# Patient Record
Sex: Male | Born: 1975 | Race: White | Hispanic: No | Marital: Married | Smoking: Former smoker
Health system: Southern US, Community
[De-identification: ages and names within clinical notes are randomized; demographics above are authoritative.]

## PROBLEM LIST (undated history)

## (undated) DIAGNOSIS — G4730 Sleep apnea, unspecified: Secondary | ICD-10-CM

## (undated) HISTORY — PX: SPINE SURGERY: SHX786

## (undated) HISTORY — DX: Sleep apnea, unspecified: G47.30

---

## 2004-09-27 ENCOUNTER — Encounter: Admission: RE | Admit: 2004-09-27 | Discharge: 2004-09-27 | Payer: Self-pay | Admitting: Family Medicine

## 2004-10-12 ENCOUNTER — Ambulatory Visit (HOSPITAL_COMMUNITY): Admission: RE | Admit: 2004-10-12 | Discharge: 2004-10-12 | Payer: Self-pay | Admitting: Anesthesiology

## 2005-06-03 ENCOUNTER — Emergency Department (HOSPITAL_COMMUNITY): Admission: EM | Admit: 2005-06-03 | Discharge: 2005-06-03 | Payer: Self-pay | Admitting: Emergency Medicine

## 2006-10-18 ENCOUNTER — Emergency Department (HOSPITAL_COMMUNITY): Admission: EM | Admit: 2006-10-18 | Discharge: 2006-10-18 | Payer: Self-pay | Admitting: Emergency Medicine

## 2008-09-07 ENCOUNTER — Ambulatory Visit (HOSPITAL_COMMUNITY): Admission: RE | Admit: 2008-09-07 | Discharge: 2008-09-07 | Payer: Self-pay | Admitting: Orthopedic Surgery

## 2010-03-23 IMAGING — CR DG ORBITS FOR FOREIGN BODY
2 series · 2 of 2 positions shown · non-contrast
Comparison: None

CLINICAL DATA: Pre MRI screening

ORBITS FOR FOREIGN BODY - 2 VIEW

[w waters (1 of 2)]
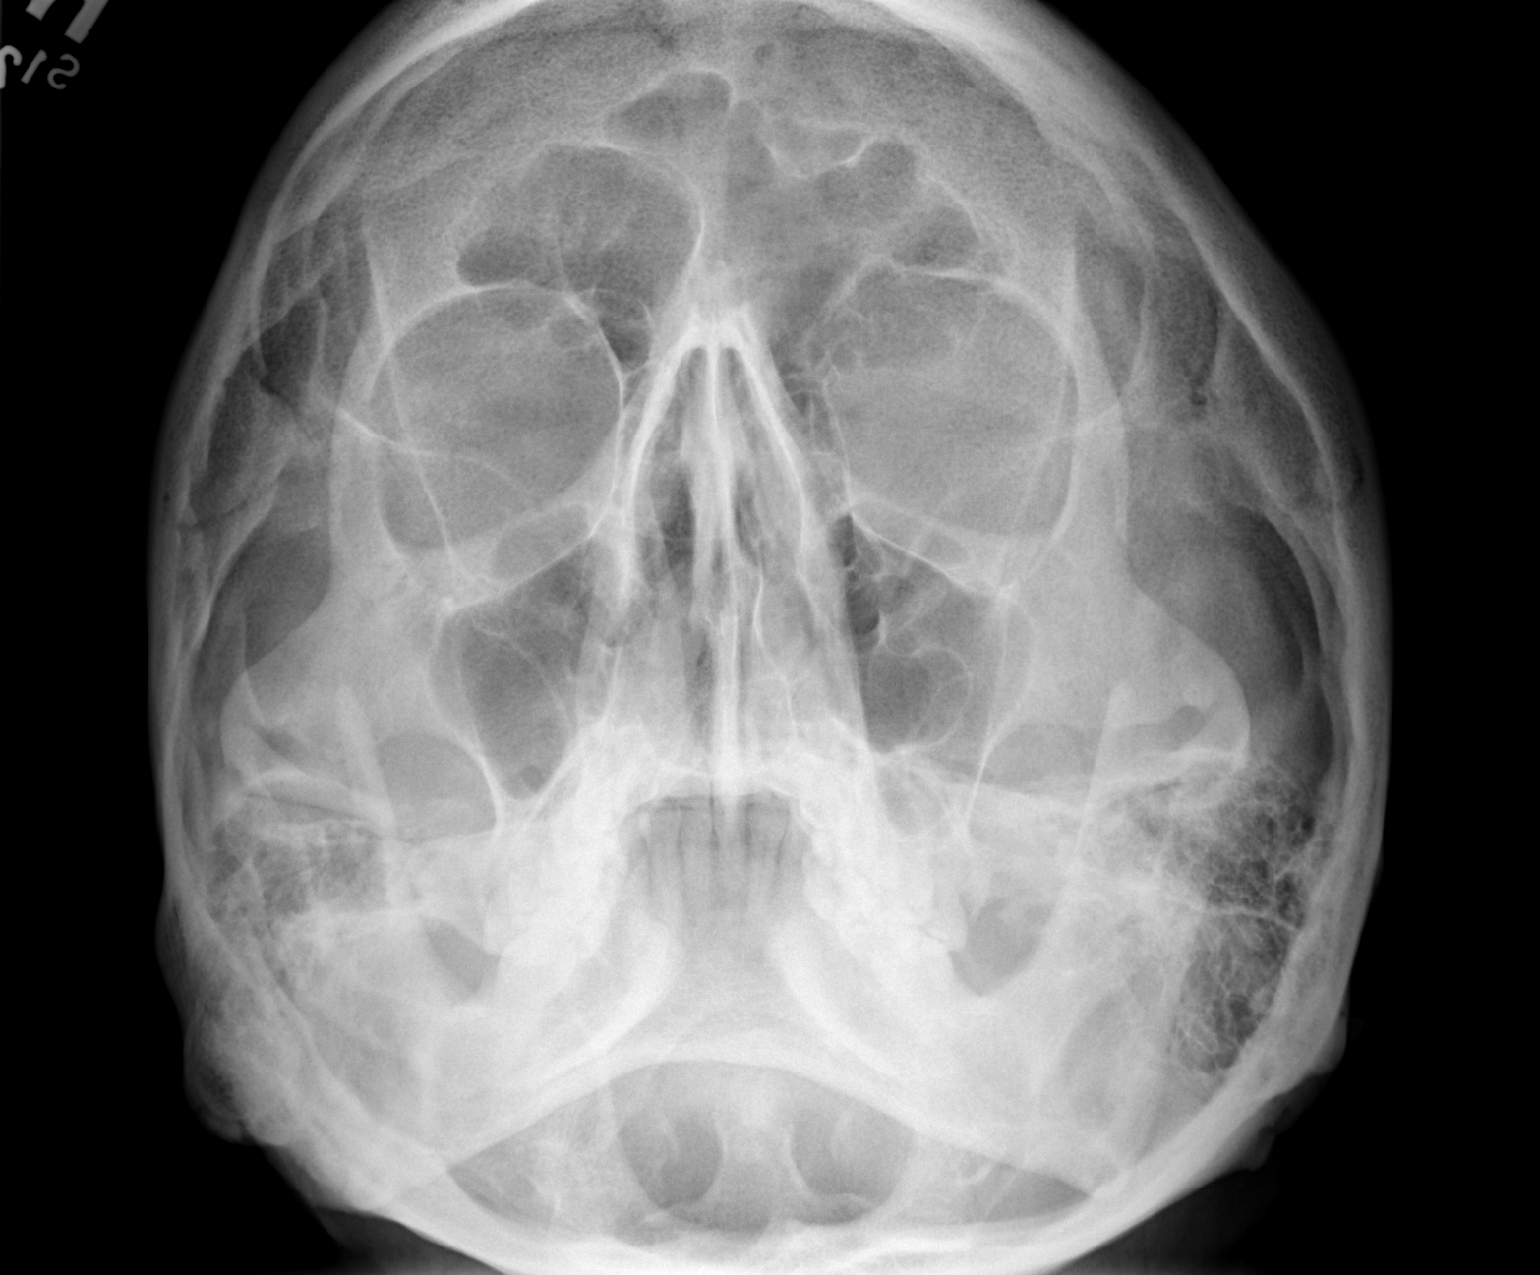

[w waters (2 of 2)]
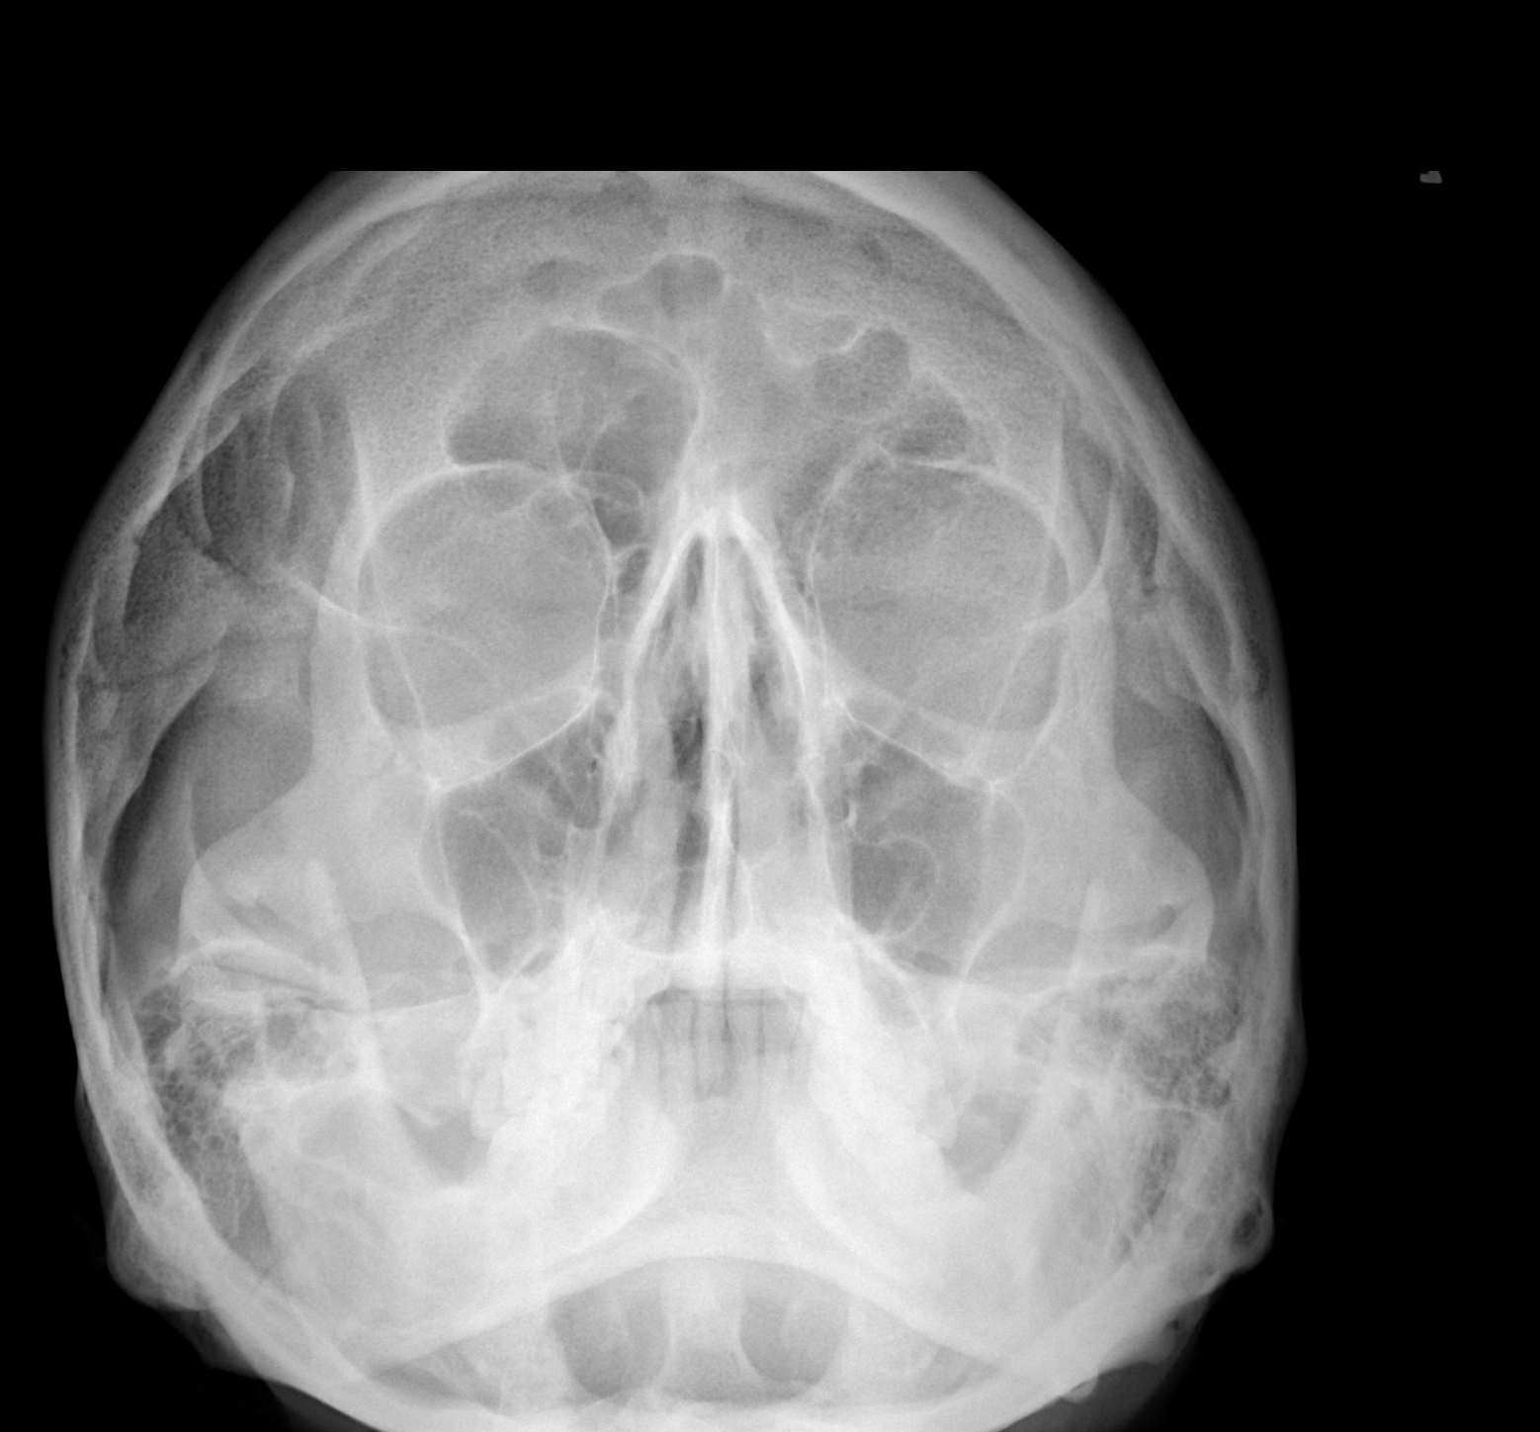

[2 of 2 positions shown; findings below may reference images not displayed]

FINDINGS: No radiopaque foreign bodies are identified within the
orbits.
IMPRESSION: 1.  No foreign bodies to preclude MRI.

## 2011-01-19 NOTE — Op Note (Signed)
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   PROCEDURE PERFORMED:  Right L5-S1 hemilaminectomy, medial facetectomy and  foraminotomy followed by microdiskectomy, L5-S1on the right utilizing  microscopic dissection.   SURGEON:  Dr. Marikay Alar.   ASSISTANT:  Reinaldo Meeker, M.D.   ANESTHESIA:  General tracheal.   COMPLICATIONS:  None apparent.   INDICATIONS FOR PROCEDURE:  Mr. Dragone is a 35 year old white male who was  referred with right leg pain in an S1 distribution. He had MRI which showed  a large disk herniation L5-S1 on the right.  He had tried medical management  without significant relief. I recommended microdiskectomy at L5-S1 on the  right.  He understood the risks, benefits and alternatives and wished to  proceed.   DESCRIPTION OF PROCEDURE:  The patient was taken to operating room and after  induction of adequate generalized endotracheal anesthesia, he was rolled  into the prone position on the Wilson frame. All pressure points were  padded. His lumbar region was prepped with DuraPrep and then draped in usual  sterile fashion.  3 cc of local anesthesia was injected and then a dorsal  midline incision was made and carried down to the lumbosacral fascia. The  fascia was opened and the paraspinous musculature was taken down in  subperiosteal fashion to expose L5-S1 interspace on the right side.  Intraoperative x-ray confirmed my level and then a hemilaminectomy and  medial facetectomy, and  foraminotomy was performed at L5-S1 on the right  side. The yellow ligament was identified, opened and removed to expose the  underlying dura and S1 nerve root.  The S1 nerve root was retracted medially  and a large subannular disk herniation was identified. The operating  microscope was brought to the field and utilizing microscopic dissection, a  nerve hook was used to tease out a large fragment and this was removed with  pituitary rongeur.  Then the annulus was incised and a thorough intradiskal  diskectomy was performed with pituitary rongeurs. Once the diskectomy was  complete, we palpated with coronary dilators to assure no compressive disk.  The nerve root was free and pulsatile.  The wound was irrigated with saline  solution containing bacitracin and bleeding points were dried with bipolar  cautery and with Gelfoam. The retractors were removed. The fascia was closed  with interrupted #1 Vicryl. The subcutaneous and subcuticular tissues were  closed with 2-0 and 3-0 Vicryl  and the skin was closed with Dermabond. The drapes were removed. Sterile  dressing was applied. The patient was awakened from anesthesia and  transferred to recovery room in stable  condition.  At the end of the  procedure, all sponge, needle and sponge counts were correct.      DSJ/MEDQ  D:  10/12/2004  T:  10/12/2004  Job:  [id number]

## 2013-08-18 DIAGNOSIS — R868 Other abnormal findings in specimens from male genital organs: Secondary | ICD-10-CM | POA: Insufficient documentation

## 2015-02-22 ENCOUNTER — Other Ambulatory Visit: Payer: Self-pay | Admitting: Physician Assistant

## 2015-02-22 ENCOUNTER — Ambulatory Visit
Admission: RE | Admit: 2015-02-22 | Discharge: 2015-02-22 | Disposition: A | Discharge status: Home / Self Care | Payer: Self-pay | Source: Ambulatory Visit | Attending: Physician Assistant | Admitting: Physician Assistant

## 2015-02-22 DIAGNOSIS — T1490XA Injury, unspecified, initial encounter: Secondary | ICD-10-CM

## 2016-03-07 ENCOUNTER — Ambulatory Visit (INDEPENDENT_AMBULATORY_CARE_PROVIDER_SITE_OTHER): Payer: BLUE CROSS/BLUE SHIELD | Admitting: Podiatry

## 2016-03-07 ENCOUNTER — Encounter: Payer: Self-pay | Admitting: Podiatry

## 2016-03-07 VITALS — BP 103/66 | HR 74 | Resp 16 | Ht 74.0 in | Wt 175.0 lb

## 2016-03-07 DIAGNOSIS — M779 Enthesopathy, unspecified: Secondary | ICD-10-CM

## 2016-03-07 DIAGNOSIS — Q828 Other specified congenital malformations of skin: Secondary | ICD-10-CM | POA: Diagnosis not present

## 2016-03-07 MED ORDER — TRIAMCINOLONE ACETONIDE 10 MG/ML IJ SUSP
10.0000 mg | Freq: Once | INTRAMUSCULAR | Status: AC
  Administered 2016-03-07: 10 mg

## 2016-03-07 NOTE — Progress Notes (Signed)
   Subjective:    Patient ID: Thomas Mcdowell, male    DOB: [birth year], 40 y.o.   MRN: [id number]  HPI Chief Complaint  Patient presents with  . Painful lesions    Bilateral; plantar forefoot; pt stated, "Right foot hurts more than left foot"      Review of Systems  All other systems reviewed and are negative.      Objective:   Physical Exam        Assessment & Plan:

## 2016-03-07 NOTE — Progress Notes (Signed)
Subjective:     Patient ID: Reginold AgentChristopher Warmuth, male   DOB: 07-30-1976, 40 y.o.   MRN: [id number]  HPI patient presents with lesions on the plantar aspect of both feet right fifth metatarsal being worse stating that they get sore and they've occurred over the last 6 months   Review of Systems  All other systems reviewed and are negative.      Objective:   Physical Exam  Constitutional: He is oriented to person, place, and time.  Cardiovascular: Intact distal pulses.   Musculoskeletal: Normal range of motion.  Neurological: He is oriented to person, place, and time.  Skin: Skin is warm.  Nursing note and vitals reviewed.  neurovascular status found to be intact with muscle strength adequate range of motion within normal limits. Patient's found have inflammation and pain around the right fifth metatarsal with fluid buildup occurring around the head and several of the lesions that are sore. Since noted to have good digital perfusion is well oriented 3     Assessment:     Inflammatory capsulitis right fifth MPJ with keratotic lesion formation bilateral    Plan:     H&P conditions reviewed and today I injected the capsule of the right fifth MPJ 3 mg Dexon some Kenalog 5 mg Xylocaine and debrided lesions. I then discussed that these will recur at one point and could ultimately require surgery depending on response

## 2016-11-29 ENCOUNTER — Ambulatory Visit (INDEPENDENT_AMBULATORY_CARE_PROVIDER_SITE_OTHER): Payer: BLUE CROSS/BLUE SHIELD | Admitting: Podiatry

## 2016-11-29 DIAGNOSIS — Q828 Other specified congenital malformations of skin: Secondary | ICD-10-CM

## 2016-11-29 DIAGNOSIS — M779 Enthesopathy, unspecified: Secondary | ICD-10-CM | POA: Diagnosis not present

## 2016-11-29 MED ORDER — TRIAMCINOLONE ACETONIDE 10 MG/ML IJ SUSP
10.0000 mg | Freq: Once | INTRAMUSCULAR | Status: AC
  Administered 2016-11-29: 10 mg

## 2016-11-30 NOTE — Progress Notes (Signed)
Subjective:     Patient ID: Thomas Mcdowell, male   DOB: Jul 23, 1976, 41 y.o.   MRN: [id number]  HPI patient presents with painful lesions on the outside of the fifth metatarsal bilateral with fluid buildup. States that this has been going on for a long time and he does keep on a good schedule with medication and trimming   Review of Systems     Objective:   Physical Exam Neurovascular status intact muscle strength adequate patient found have inflammatory capsule fifth MPJ bilateral with keratotic lesion that is painful when pressed    Assessment:     Mechanical dysfunction with inflammatory capsulitis fifth MPJ bilateral and lesion formation    Plan:     Careful capsular injections administered bilateral to milligrams dexamethasone Kenalog 5 mg Xylocaine and debrided lesions bilateral with no iatrogenic bleeding noted

## 2017-11-25 ENCOUNTER — Encounter: Payer: Self-pay | Admitting: Podiatry

## 2017-11-25 ENCOUNTER — Ambulatory Visit: Payer: BLUE CROSS/BLUE SHIELD | Admitting: Podiatry

## 2017-11-25 DIAGNOSIS — Q828 Other specified congenital malformations of skin: Secondary | ICD-10-CM

## 2017-11-25 DIAGNOSIS — M779 Enthesopathy, unspecified: Secondary | ICD-10-CM | POA: Diagnosis not present

## 2017-11-25 MED ORDER — TRIAMCINOLONE ACETONIDE 10 MG/ML IJ SUSP
10.0000 mg | Freq: Once | INTRAMUSCULAR | Status: AC
  Administered 2017-11-25: 10 mg

## 2017-11-25 NOTE — Progress Notes (Signed)
Subjective:   Patient ID: Thomas Mcdowell, Thomas Mcdowell   DOB: 42 y.o.   MRN: [id number]   HPI Patient presents stating he has this painful lesion on the right foot and it just started to hurt him in the last couple months again.  Stated he had around 10 months of relief with the treatment that we did and it has now become symptomatic   ROS      Objective:  Physical Exam  Neurovascular status intact with inflammation fluid around the fifth MPJ right with pain upon palpation with lucent type lesion noted     Assessment:  Inflammatory capsulitis fifth MPJ right with painful keratotic lesion formation     Plan:  H&P education rendered discussed possible surgical intervention at one point in future.  Today I injected the capsule of the fifth MPJ 3 mg dexamethasone Kenalog 5 mg Xylocaine did deep debridement of lesion applied salicylic acid with sterile dressing and instructed on leaving this on for 24-48 hours.  Reappoint when symptomatic and may require other treatments depending on response

## 2018-01-02 ENCOUNTER — Encounter: Payer: BLUE CROSS/BLUE SHIELD | Admitting: Podiatry

## 2018-01-22 NOTE — Progress Notes (Signed)
This encounter was created in error - please disregard.

## 2019-07-27 ENCOUNTER — Ambulatory Visit: Payer: BLUE CROSS/BLUE SHIELD | Admitting: Podiatry

## 2019-08-21 ENCOUNTER — Other Ambulatory Visit: Payer: Self-pay

## 2019-08-21 ENCOUNTER — Encounter: Payer: Self-pay | Admitting: Podiatry

## 2019-08-21 ENCOUNTER — Ambulatory Visit: Payer: BC Managed Care – PPO | Admitting: Podiatry

## 2019-08-21 DIAGNOSIS — Q828 Other specified congenital malformations of skin: Secondary | ICD-10-CM

## 2019-08-21 DIAGNOSIS — M7751 Other enthesopathy of right foot: Secondary | ICD-10-CM | POA: Diagnosis not present

## 2019-08-21 DIAGNOSIS — M779 Enthesopathy, unspecified: Secondary | ICD-10-CM

## 2019-08-24 NOTE — Progress Notes (Signed)
Subjective:   Patient ID: Thomas Mcdowell, male   DOB: 43 y.o.   MRN: [id number]   HPI Patient states he is developed quite a bit of inflammation around the right metatarsal and has chronic lesions that form   ROS      Objective:  Physical Exam  Neurovascular status intact with patient found to have quite a bit of inflammation around the fifth MPJ right with lesion formation also noted fluid buildup with lesion bilateral      Assessment:  Inflammatory capsulitis right with lesion formation noted right over left     Plan:  H&P reviewed condition did a sterile prep and injected the fifth MPJ 3 mg Dexasone Kenalog and debrided lesions on both feet with no iatrogenic bleeding and reappoint for routine care as needed

## 2020-11-18 DIAGNOSIS — Z1152 Encounter for screening for COVID-19: Secondary | ICD-10-CM | POA: Diagnosis not present

## 2020-11-30 ENCOUNTER — Encounter: Payer: Self-pay | Admitting: Podiatry

## 2020-11-30 ENCOUNTER — Other Ambulatory Visit: Payer: Self-pay

## 2020-11-30 ENCOUNTER — Ambulatory Visit (INDEPENDENT_AMBULATORY_CARE_PROVIDER_SITE_OTHER): Payer: Self-pay | Admitting: Podiatry

## 2020-11-30 DIAGNOSIS — M6740 Ganglion, unspecified site: Secondary | ICD-10-CM

## 2020-11-30 DIAGNOSIS — G5621 Lesion of ulnar nerve, right upper limb: Secondary | ICD-10-CM | POA: Diagnosis not present

## 2020-11-30 DIAGNOSIS — Q828 Other specified congenital malformations of skin: Secondary | ICD-10-CM

## 2020-11-30 NOTE — Progress Notes (Signed)
Subjective:   Patient ID: Reginold Agent, male   DOB: 45 y.o.   MRN: [id number]   HPI Patient presents with several different lesions on the right big toe of the right fifth metatarsal left foot that are painful when palpated    ROS      Objective:  Physical Exam  Neurovascular status intact with 3 different lesions that do have loosened course and are painful     Assessment:  Porokeratotic lesions bilateral      Plan:  Debridement of lesions no iatrogenic bleeding reappoint routine care

## 2021-07-21 DIAGNOSIS — K602 Anal fissure, unspecified: Secondary | ICD-10-CM | POA: Diagnosis not present

## 2021-08-21 DIAGNOSIS — H53143 Visual discomfort, bilateral: Secondary | ICD-10-CM | POA: Diagnosis not present

## 2021-08-21 DIAGNOSIS — H5213 Myopia, bilateral: Secondary | ICD-10-CM | POA: Diagnosis not present

## 2021-08-21 DIAGNOSIS — H52223 Regular astigmatism, bilateral: Secondary | ICD-10-CM | POA: Diagnosis not present

## 2021-08-25 DIAGNOSIS — K600 Acute anal fissure: Secondary | ICD-10-CM | POA: Diagnosis not present

## 2021-09-05 DIAGNOSIS — K5730 Diverticulosis of large intestine without perforation or abscess without bleeding: Secondary | ICD-10-CM | POA: Diagnosis not present

## 2021-09-05 DIAGNOSIS — K625 Hemorrhage of anus and rectum: Secondary | ICD-10-CM | POA: Diagnosis not present

## 2021-09-05 DIAGNOSIS — K648 Other hemorrhoids: Secondary | ICD-10-CM | POA: Diagnosis not present

## 2022-02-09 ENCOUNTER — Encounter: Payer: Self-pay | Admitting: Podiatry

## 2022-02-09 ENCOUNTER — Ambulatory Visit: Payer: 59 | Admitting: Podiatry

## 2022-02-09 DIAGNOSIS — Q828 Other specified congenital malformations of skin: Secondary | ICD-10-CM

## 2022-02-09 NOTE — Progress Notes (Signed)
Subjective:   Patient ID: [place] Carbo, male   DOB: 46 y.o.   MRN: [id number]   HPI Patient presents with chronic lesions on the bottom of both feet that have been sore   ROS      Objective:  Physical Exam  Neurovascular status intact lucent cord lesions bilateral painful when pressed     Assessment:  Chronic porokeratotic painful lesions bilateral     Plan:  Debridement lesions bilateral no iatrogenic bleeding reappoint routine care

## 2022-09-21 DIAGNOSIS — H5213 Myopia, bilateral: Secondary | ICD-10-CM | POA: Diagnosis not present

## 2023-09-27 ENCOUNTER — Encounter: Payer: Self-pay | Admitting: Podiatry

## 2023-09-27 ENCOUNTER — Ambulatory Visit (INDEPENDENT_AMBULATORY_CARE_PROVIDER_SITE_OTHER): Payer: Commercial Managed Care - PPO | Admitting: Podiatry

## 2023-09-27 ENCOUNTER — Ambulatory Visit (INDEPENDENT_AMBULATORY_CARE_PROVIDER_SITE_OTHER): Payer: Commercial Managed Care - PPO

## 2023-09-27 DIAGNOSIS — M6740 Ganglion, unspecified site: Secondary | ICD-10-CM

## 2023-09-27 DIAGNOSIS — M205X9 Other deformities of toe(s) (acquired), unspecified foot: Secondary | ICD-10-CM | POA: Diagnosis not present

## 2023-09-27 DIAGNOSIS — M216X1 Other acquired deformities of right foot: Secondary | ICD-10-CM

## 2023-09-27 DIAGNOSIS — M89279 Other disorders of bone development and growth, unspecified ankle and foot: Secondary | ICD-10-CM

## 2023-09-27 NOTE — Progress Notes (Signed)
Subjective:   Patient ID: Thomas Mcdowell, male   DOB: 48 y.o.   MRN: [id number]   HPI Patient presents stating that he has had a growth on his right big toe which has been present for a number of months and has drained several times and it is flat now but he is concerned concerned about it and has history of lesions underneath both feet.   ROS      Objective:  Physical Exam  Neuro vascular status intact muscle strength adequate what appears to be mucoid cyst at the distal to phalangeal joint right big toe with what appears to be a significant functional hallux limitus condition bilateral and lesions plantar with lucent cores     Assessment:  Probability for mucoid cyst right with possible arthritis of the joint with functional hallux limitus deformity bilateral and plantar porokeratotic lesions     Plan:  H&P reviewed all 3 conditions.  Will start compression of the hallux right and explained him how to do this and this will start if he has to drain this again and I did discuss possible excision in future if it remains a problem and did also have him watch the functional hallux limitus and if it were to get worse may require surgery 1 point in future.  No debridement plantar accomplished  X-rays indicate elevation first metatarsal segment no other indications pathology

## 2023-11-19 ENCOUNTER — Telehealth (HOSPITAL_BASED_OUTPATIENT_CLINIC_OR_DEPARTMENT_OTHER): Payer: Self-pay | Admitting: *Deleted

## 2023-11-19 NOTE — Telephone Encounter (Signed)
 Pt has made an appt with Dr. De Peru in May 2025. Will keep appt as scheduled.

## 2023-11-19 NOTE — Telephone Encounter (Signed)
 Copied from CRM [id number]. Topic: Appointments - Transfer of Care >> Nov 19, 2023  9:51 AM Gery Pray wrote: Pt is requesting to transfer FROM: Thomas Mcdowell Pt is requesting to transfer TO: Ceasar Mons Peru Reason for requested transfer: Provider Retired It is the responsibility of the team the patient would like to transfer to (Dr. Ceasar Mons Peru) to reach out to the patient if for any reason this transfer is not acceptable.

## 2024-01-28 ENCOUNTER — Ambulatory Visit (HOSPITAL_BASED_OUTPATIENT_CLINIC_OR_DEPARTMENT_OTHER): Payer: Self-pay | Admitting: Family Medicine

## 2024-01-28 DIAGNOSIS — G4730 Sleep apnea, unspecified: Secondary | ICD-10-CM | POA: Insufficient documentation

## 2024-01-28 DIAGNOSIS — G4733 Obstructive sleep apnea (adult) (pediatric): Secondary | ICD-10-CM

## 2024-01-28 DIAGNOSIS — Z7689 Persons encountering health services in other specified circumstances: Secondary | ICD-10-CM

## 2024-01-28 DIAGNOSIS — Z0000 Encounter for general adult medical examination without abnormal findings: Secondary | ICD-10-CM

## 2024-01-28 NOTE — Progress Notes (Unsigned)
 New Patient Office Visit  Subjective   Patient ID: Thomas Mcdowell, male    DOB: 07/03/1976  Age: 48 y.o. MRN: [id number]  CC:  Chief Complaint  Patient presents with   New Patient (Initial Visit)    Patient is here today to get established with the practice. Denies any main concerns for today's visit.    HPI [name] presents to establish care Last PCP - Dr. Euel Herring at [facility]: following with the sleep center related to this.  Does see podiatry for sweat ducts on feet that get clogged.  Had back surgery and knee surgery in the past.  Patient is originally from [place], has lived here almost 30 years. Patient works as Environmental manager. He enjoys lifting weights, ice skating, rock climbing, hiking.  No outpatient encounter medications on file as of 01/28/2024.   No facility-administered encounter medications on file as of 01/28/2024.    Past Medical History:  Diagnosis Date   Sleep apnea    Diagnosed as mild    Past Surgical History:  Procedure Laterality Date   SPINE SURGERY  2008    Family History  Problem Relation Age of Onset   Cancer Father     Social History   Socioeconomic History   Marital status: Married    Spouse name: Not on file   Number of children: Not on file   Years of education: Not on file   Highest education level: Associate degree: academic program  Occupational History   Not on file  Tobacco Use   Smoking status: Former   Smokeless tobacco: Never  Substance and Sexual Activity   Alcohol use: Not Currently   Drug use: Never   Sexual activity: Yes  Other Topics Concern   Not on file  Social History Narrative   Not on file   Social Drivers of Health   Financial Resource Strain: Patient Declined (01/28/2024)   Overall Financial Resource Strain (CARDIA)    Difficulty of Paying Living Expenses: Patient declined  Food Insecurity: Patient Declined (01/28/2024)   Hunger Vital Sign    Worried About Running Out of Food in  the Last Year: Patient declined    Ran Out of Food in the Last Year: Patient declined  Transportation Needs: No Transportation Needs (01/28/2024)   PRAPARE - Administrator, Civil Service (Medical): No    Lack of Transportation (Non-Medical): No  Physical Activity: Sufficiently Active (01/28/2024)   Exercise Vital Sign    Days of Exercise per Week: 4 days    Minutes of Exercise per Session: 40 min  Stress: Stress Concern Present (01/28/2024)   [employer] of Occupational Health - Occupational Stress Questionnaire    Feeling of Stress : To some extent  Social Connections: Unknown (01/28/2024)   Social Connection and Isolation Panel [NHANES]    Frequency of Communication with Friends and Family: Patient declined    Frequency of Social Gatherings with Friends and Family: Patient declined    Attends Religious Services: Patient declined    Database administrator or Organizations: Patient declined    Attends Engineer, structural: Not on file    Marital Status: Married  Catering manager Violence: Not on file    Objective   BP 123/80 (BP Location: Left Arm, Patient Position: Sitting, Cuff Size: Normal)   Pulse 68   Ht 6\' 2"  (1.88 m)   Wt 181 lb 6.4 oz (82.3 kg)   SpO2 100%   BMI 23.29 kg/m  Physical Exam  48  Assessment & Plan:   There are no diagnoses linked to this encounter.No follow-ups on file.    ___________________________________________ Tehila Sokolow de Peru, MD, ABFM, [facility] Primary Care and Sports Medicine [facility]

## 2024-01-28 NOTE — Patient Instructions (Signed)
  Medication Instructions:  Your physician recommends that you continue on your current medications as directed. Please refer to the Current Medication list given to you today. --If you need a refill on any your medications before your next appointment, please call your pharmacy first. If no refills are authorized on file call the office.-- Lab Work: Your physician has recommended that you have lab work today: 1 week before physical If you have labs (blood work) drawn today and your tests are completely normal, you will receive your results via MyChart message OR a phone call from our staff.  Please ensure you check your voicemail in the event that you authorized detailed messages to be left on a delegated number. If you have any lab test that is abnormal or we need to change your treatment, we will call you to review the results.  Referrals/Procedures/Imaging: no  Follow-Up: Your next appointment:   Your physician recommends that you schedule a follow-up appointment in: 1-2 months for physical with Dr. de Peru  You will receive a text message or e-mail with a link to a survey about your care and experience with us  today! We would greatly appreciate your feedback!   Thanks for letting us  be apart of your health journey!!  Primary Care and Sports Medicine   Dr. Court Distance Peru   We encourage you to activate your patient portal called "MyChart".  Sign up information is provided on this After Visit Summary.  MyChart is used to connect with patients for Virtual Visits (Telemedicine).  Patients are able to view lab/test results, encounter notes, upcoming appointments, etc.  Non-urgent messages can be sent to your provider as well. To learn more about what you can do with MyChart, please visit --  [email or web address].

## 2024-01-29 DIAGNOSIS — Z7689 Persons encountering health services in other specified circumstances: Secondary | ICD-10-CM | POA: Insufficient documentation

## 2024-01-29 NOTE — Assessment & Plan Note (Signed)
 We will schedule physical in the near future, patient will return for labs about a week before appointment.  If any issues do arise before then, can return office sooner for further evaluation

## 2024-01-29 NOTE — Assessment & Plan Note (Signed)
 Can continue with close follow-up with sleep center for continued management.

## 2024-04-06 ENCOUNTER — Other Ambulatory Visit (HOSPITAL_BASED_OUTPATIENT_CLINIC_OR_DEPARTMENT_OTHER): Payer: Self-pay | Admitting: *Deleted

## 2024-04-06 DIAGNOSIS — Z0000 Encounter for general adult medical examination without abnormal findings: Secondary | ICD-10-CM

## 2024-04-07 ENCOUNTER — Ambulatory Visit (HOSPITAL_BASED_OUTPATIENT_CLINIC_OR_DEPARTMENT_OTHER): Payer: Self-pay | Admitting: Family Medicine

## 2024-04-07 LAB — TSH RFX ON ABNORMAL TO FREE T4: TSH: 1.72 u[IU]/mL (ref 0.450–4.500)

## 2024-04-07 LAB — COMPREHENSIVE METABOLIC PANEL WITH GFR
ALT: 13 IU/L (ref 0–44)
AST: 18 IU/L (ref 0–40)
Albumin: 4.7 g/dL (ref 4.1–5.1)
Alkaline Phosphatase: 130 IU/L — ABNORMAL HIGH (ref 44–121)
BUN/Creatinine Ratio: 12 (ref 9–20)
BUN: 14 mg/dL (ref 6–24)
Bilirubin Total: 0.7 mg/dL (ref 0.0–1.2)
CO2: 22 mmol/L (ref 20–29)
Calcium: 9.5 mg/dL (ref 8.7–10.2)
Chloride: 102 mmol/L (ref 96–106)
Creatinine, Ser: 1.17 mg/dL (ref 0.76–1.27)
Globulin, Total: 2.3 g/dL (ref 1.5–4.5)
Glucose: 88 mg/dL (ref 70–99)
Potassium: 4.7 mmol/L (ref 3.5–5.2)
Sodium: 140 mmol/L (ref 134–144)
Total Protein: 7 g/dL (ref 6.0–8.5)
eGFR: 77 mL/min/1.73 (ref >59)

## 2024-04-07 LAB — CBC WITH DIFFERENTIAL/PLATELET
Basophils Absolute: 0 x10E3/uL (ref 0.0–0.2)
Basos: 0 %
EOS (ABSOLUTE): 0.1 x10E3/uL (ref 0.0–0.4)
Eos: 2 %
Hematocrit: 45.5 % (ref 37.5–51.0)
Hemoglobin: 15.1 g/dL (ref 13.0–17.7)
Immature Grans (Abs): 0 x10E3/uL (ref 0.0–0.1)
Immature Granulocytes: 0 %
Lymphocytes Absolute: 1.4 x10E3/uL (ref 0.7–3.1)
Lymphs: 32 %
MCH: 30.1 pg (ref 26.6–33.0)
MCHC: 33.2 g/dL (ref 31.5–35.7)
MCV: 91 fL (ref 79–97)
Monocytes Absolute: 0.3 x10E3/uL (ref 0.1–0.9)
Monocytes: 6 %
Neutrophils Absolute: 2.6 x10E3/uL (ref 1.4–7.0)
Neutrophils: 60 %
Platelets: 132 x10E3/uL — ABNORMAL LOW (ref 150–450)
RBC: 5.02 x10E6/uL (ref 4.14–5.80)
RDW: 12.3 % (ref 11.6–15.4)
WBC: 4.4 x10E3/uL (ref 3.4–10.8)

## 2024-04-07 LAB — LIPID PANEL
Chol/HDL Ratio: 3.4 ratio (ref 0.0–5.0)
Cholesterol, Total: 181 mg/dL (ref 100–199)
HDL: 54 mg/dL (ref >39)
LDL Chol Calc (NIH): 114 mg/dL — ABNORMAL HIGH (ref 0–99)
Triglycerides: 71 mg/dL (ref 0–149)
VLDL Cholesterol Cal: 13 mg/dL (ref 5–40)

## 2024-04-14 ENCOUNTER — Ambulatory Visit (INDEPENDENT_AMBULATORY_CARE_PROVIDER_SITE_OTHER): Admitting: Family Medicine

## 2024-04-14 ENCOUNTER — Encounter (HOSPITAL_BASED_OUTPATIENT_CLINIC_OR_DEPARTMENT_OTHER): Payer: Self-pay

## 2024-04-14 VITALS — BP 131/81 | HR 69 | Temp 97.8°F | Resp 20 | Ht 74.0 in | Wt 174.0 lb

## 2024-04-14 DIAGNOSIS — Z125 Encounter for screening for malignant neoplasm of prostate: Secondary | ICD-10-CM

## 2024-04-14 DIAGNOSIS — K6289 Other specified diseases of anus and rectum: Secondary | ICD-10-CM | POA: Insufficient documentation

## 2024-04-14 DIAGNOSIS — K5730 Diverticulosis of large intestine without perforation or abscess without bleeding: Secondary | ICD-10-CM | POA: Insufficient documentation

## 2024-04-14 DIAGNOSIS — Z0000 Encounter for general adult medical examination without abnormal findings: Secondary | ICD-10-CM | POA: Insufficient documentation

## 2024-04-14 DIAGNOSIS — D696 Thrombocytopenia, unspecified: Secondary | ICD-10-CM | POA: Diagnosis not present

## 2024-04-14 DIAGNOSIS — K602 Anal fissure, unspecified: Secondary | ICD-10-CM | POA: Insufficient documentation

## 2024-04-14 NOTE — Assessment & Plan Note (Signed)
 Mild thrombocytopenia observed on recent labs.  Denies any prior issues with low platelets in the past.  No current concerns related to bleeding or bruising.  Does report that he follows a vegan diet. Discussed potential causes for observed mild thrombocytopenia.  We can plan to repeat labs for monitoring in about 6 weeks.  Will also assess for possible vitamin deficiencies such as B12 deficiency.  Further recommendations pending results of these labs.

## 2024-04-14 NOTE — Progress Notes (Signed)
 Subjective:    CC: Annual Physical Exam  HPI: Thomas Mcdowell is a 48 y.o. presenting for annual physical  I reviewed the past medical history, family history, social history, surgical history, and allergies today and no changes were needed.  Please see the problem list section below in epic for further details.  Past Medical History: Past Medical History:  Diagnosis Date   Sleep apnea    Diagnosed as mild   Past Surgical History: Past Surgical History:  Procedure Laterality Date   SPINE SURGERY  2008   Social History: Social History   Socioeconomic History   Marital status: Married    Spouse name: Not on file   Number of children: Not on file   Years of education: Not on file   Highest education level: Associate degree: occupational, Scientist, product/process development, or vocational program  Occupational History   Not on file  Tobacco Use   Smoking status: Former    Passive exposure: Past   Smokeless tobacco: Never  Substance and Sexual Activity   Alcohol use: Not Currently   Drug use: Never   Sexual activity: Yes  Other Topics Concern   Not on file  Social History Narrative   Not on file   Social Drivers of Health   Financial Resource Strain: Patient Declined (04/14/2024)   Overall Financial Resource Strain (CARDIA)    Difficulty of Paying Living Expenses: Patient declined  Food Insecurity: Patient Declined (04/14/2024)   Hunger Vital Sign    Worried About Running Out of Food in the Last Year: Patient declined    Ran Out of Food in the Last Year: Patient declined  Transportation Needs: No Transportation Needs (04/14/2024)   PRAPARE - Administrator, Civil Service (Medical): No    Lack of Transportation (Non-Medical): No  Physical Activity: Insufficiently Active (04/14/2024)   Exercise Vital Sign    Days of Exercise per Week: 4 days    Minutes of Exercise per Session: 30 min  Stress: No Stress Concern Present (04/14/2024)   [employer] of Occupational Health -  Occupational Stress Questionnaire    Feeling of Stress: Only a little  Recent Concern: Stress - Stress Concern Present (01/28/2024)   [employer] of Occupational Health - Occupational Stress Questionnaire    Feeling of Stress : To some extent  Social Connections: Unknown (04/14/2024)   Social Connection and Isolation Panel    Frequency of Communication with Friends and Family: Patient declined    Frequency of Social Gatherings with Friends and Family: Patient declined    Attends Religious Services: Patient declined    Database administrator or Organizations: Patient declined    Attends Engineer, structural: Not on file    Marital Status: Married   Family History: Family History  Problem Relation Age of Onset   Cancer Father    Allergies: No Known Allergies Medications: See med rec.  Review of Systems: No headache, visual changes, nausea, vomiting, diarrhea, constipation, dizziness, abdominal pain, skin rash, fevers, chills, night sweats, swollen lymph nodes, weight loss, chest pain, body aches, joint swelling, muscle aches, shortness of breath, mood changes, visual or auditory hallucinations.  Objective:    BP 131/81   Pulse 69   Temp 97.8 F (36.6 C) (Oral)   Resp 20   Ht 6' 2 (1.88 m)   Wt 174 lb (78.9 kg)   SpO2 98%   BMI 22.34 kg/m   General: Well Developed, well nourished, and in no acute distress. Neuro: Alert and  oriented x3, extra-ocular muscles intact, sensation grossly intact. Cranial nerves II through XII are intact, motor, sensory, and coordinative functions are all intact. HEENT: Normocephalic, atraumatic, pupils equal round reactive to light, neck supple, no masses, no lymphadenopathy, thyroid nonpalpable. Oropharynx, nasopharynx, external ear canals are unremarkable. Skin: Warm and dry, no rashes noted. Cardiac: Regular rate and rhythm, no murmurs rubs or gallops. Respiratory: Clear to auscultation bilaterally. Not using accessory muscles,  speaking in full sentences. Abdominal: Soft, nontender, nondistended, positive bowel sounds, no masses, no organomegaly. Musculoskeletal: Shoulder, elbow, wrist, hip, knee, ankle stable, and with full range of motion.  Impression and Recommendations:    Wellness examination Assessment & Plan: Routine HCM labs reviewed. HCM reviewed/discussed. Anticipatory guidance regarding healthy weight, lifestyle and choices given. Recommend healthy diet.  Recommend approximately 150 minutes/week of moderate intensity exercise Recommend regular dental and vision exams Always use seatbelt/lap and shoulder restraints Recommend using smoke alarms and checking batteries at least twice a year Recommend using sunscreen when outside Discussed colon cancer screening recommendations, options.  Patient is UTD Discussed tetanus immunization recommendations, patient would be amenable to having this updated The natural history of prostate cancer and ongoing controversy regarding screening and potential treatment outcomes of prostate cancer has been discussed with the patient. The meaning of a false positive PSA and a false negative PSA has been discussed. He indicates understanding of the limitations of this screening test and wishes to proceed with screening PSA testing.   Thrombocytopenia (HCC) Assessment & Plan: Mild thrombocytopenia observed on recent labs.  Denies any prior issues with low platelets in the past.  No current concerns related to bleeding or bruising.  Does report that he follows a vegan diet. Discussed potential causes for observed mild thrombocytopenia.  We can plan to repeat labs for monitoring in about 6 weeks.  Will also assess for possible vitamin deficiencies such as B12 deficiency.  Further recommendations pending results of these labs.  Orders: -     CBC with Differential/Platelet; Future -     Vitamin B12; Future  Prostate cancer screening -     PSA Total (Reflex To Free);  Future  Return in about 1 year (around 04/14/2025) for CPE.   ___________________________________________ Terilynn Buresh de Peru, MD, ABFM, [facility] Primary Care and Sports Medicine [facility]

## 2024-04-14 NOTE — Addendum Note (Signed)
 Addended by: DE PERU, Antionio Negron J on: 04/14/2024 10:03 AM   Modules accepted: Level of Service

## 2024-04-14 NOTE — Assessment & Plan Note (Signed)
 Routine HCM labs reviewed. HCM reviewed/discussed. Anticipatory guidance regarding healthy weight, lifestyle and choices given. Recommend healthy diet.  Recommend approximately 150 minutes/week of moderate intensity exercise Recommend regular dental and vision exams Always use seatbelt/lap and shoulder restraints Recommend using smoke alarms and checking batteries at least twice a year Recommend using sunscreen when outside Discussed colon cancer screening recommendations, options.  Patient is UTD Discussed tetanus immunization recommendations, patient would be amenable to having this updated The natural history of prostate cancer and ongoing controversy regarding screening and potential treatment outcomes of prostate cancer has been discussed with the patient. The meaning of a false positive PSA and a false negative PSA has been discussed. He indicates understanding of the limitations of this screening test and wishes to proceed with screening PSA testing.

## 2024-05-22 ENCOUNTER — Other Ambulatory Visit (HOSPITAL_BASED_OUTPATIENT_CLINIC_OR_DEPARTMENT_OTHER): Payer: Self-pay | Admitting: *Deleted

## 2024-05-22 DIAGNOSIS — Z125 Encounter for screening for malignant neoplasm of prostate: Secondary | ICD-10-CM

## 2024-05-22 DIAGNOSIS — D696 Thrombocytopenia, unspecified: Secondary | ICD-10-CM

## 2024-05-23 LAB — CBC WITH DIFFERENTIAL/PLATELET
Basophils Absolute: 0 x10E3/uL (ref 0.0–0.2)
Basos: 0 %
EOS (ABSOLUTE): 0.1 x10E3/uL (ref 0.0–0.4)
Eos: 2 %
Hematocrit: 45.9 % (ref 37.5–51.0)
Hemoglobin: 15.2 g/dL (ref 13.0–17.7)
Immature Grans (Abs): 0 x10E3/uL (ref 0.0–0.1)
Immature Granulocytes: 0 %
Lymphocytes Absolute: 1.6 x10E3/uL (ref 0.7–3.1)
Lymphs: 32 %
MCH: 30.6 pg (ref 26.6–33.0)
MCHC: 33.1 g/dL (ref 31.5–35.7)
MCV: 92 fL (ref 79–97)
Monocytes Absolute: 0.3 x10E3/uL (ref 0.1–0.9)
Monocytes: 6 %
Neutrophils Absolute: 3 x10E3/uL (ref 1.4–7.0)
Neutrophils: 60 %
Platelets: 146 x10E3/uL — ABNORMAL LOW (ref 150–450)
RBC: 4.97 x10E6/uL (ref 4.14–5.80)
RDW: 12.5 % (ref 11.6–15.4)
WBC: 5.1 x10E3/uL (ref 3.4–10.8)

## 2024-05-23 LAB — PSA TOTAL (REFLEX TO FREE): Prostate Specific Ag, Serum: 1.3 ng/mL (ref 0.0–4.0)

## 2024-05-23 LAB — VITAMIN B12: Vitamin B-12: 531 pg/mL (ref 232–1245)

## 2024-05-28 ENCOUNTER — Ambulatory Visit (HOSPITAL_BASED_OUTPATIENT_CLINIC_OR_DEPARTMENT_OTHER): Admitting: Family Medicine

## 2024-05-28 ENCOUNTER — Encounter (HOSPITAL_BASED_OUTPATIENT_CLINIC_OR_DEPARTMENT_OTHER): Payer: Self-pay | Admitting: Family Medicine

## 2024-05-28 VITALS — BP 108/72 | HR 81 | Ht 74.0 in | Wt 176.4 lb

## 2024-05-28 DIAGNOSIS — Z1283 Encounter for screening for malignant neoplasm of skin: Secondary | ICD-10-CM

## 2024-05-28 DIAGNOSIS — Z23 Encounter for immunization: Secondary | ICD-10-CM | POA: Diagnosis not present

## 2024-05-28 DIAGNOSIS — D696 Thrombocytopenia, unspecified: Secondary | ICD-10-CM | POA: Diagnosis not present

## 2024-05-28 DIAGNOSIS — L989 Disorder of the skin and subcutaneous tissue, unspecified: Secondary | ICD-10-CM | POA: Diagnosis not present

## 2024-05-28 DIAGNOSIS — R6882 Decreased libido: Secondary | ICD-10-CM | POA: Insufficient documentation

## 2024-05-28 NOTE — Progress Notes (Signed)
    Procedures performed today:    None.  Independent interpretation of notes and tests performed by another provider:   None.  Brief History, Exam, Impression, and Recommendations:    BP 108/72 (BP Location: Right Arm, Patient Position: Sitting, Cuff Size: Normal)   Pulse 81   Ht 6' 2 (1.88 m)   Wt 176 lb 6.4 oz (80 kg)   SpO2 96%   BMI 22.65 kg/m   Thrombocytopenia Assessment & Plan: Noted on prior labs with mild degree of thrombocytopenia.  He did have repeat labs recently and this shows continued thrombocytopenia, however improved compared to initial labs.  Most recent labs just below lower limit of normal.  No other lab abnormalities identified such as anemia or white blood cell issues. Given isolated thrombocytopenia which is fairly mild, would be reasonable to continue with monitoring.  Would plan to recheck CBC in about 4 to 6 months.  Will have patient return for lab appointment for this.  Further recommendations pending results of labs.  If notable progression of thrombocytopenia, consider referral to hematology for further evaluation.  If platelets remain stable or improved, then can continue with routine monitoring   Encounter for immunization -     Flu vaccine trivalent PF, 6mos and older(Flulaval,Afluria,Fluarix,Fluzone)  Skin lesion Assessment & Plan: Patient notes that he has had small lesion over right forearm proximally.  This is something that he has noted in recent weeks/months.  He has not noticed any itching, pain, bleeding, discharge. On exam, he does have a very small lesion over the right forearm that is light brown in appearance, raised, slightly stuck on appearance.  No current drainage, bleeding.  No surrounding erythema. Area appears to be most consistent with very small seborrheic keratosis.  Discussed that based on appearance, area does appear to be related to benign skin lesion.  Would be reasonable to arrange for further evaluation with dermatology  for this lesion as well as skin exam.  Referral has been placed today.  Orders: -     Ambulatory referral to Dermatology  Decreased libido Assessment & Plan: Patient reports concerns related to decreased libido.  He wonders about having testosterone checked as a result of this.  Feels that he has also had some mood related symptoms. We discussed considerations, can proceed with testosterone evaluation at this time.  Discussed that this will typically be checked between 8 and 10 in the morning as this can fluctuate throughout the day and this provides most accurate assessment.  He will return to have this done in the near future, further recommendations pending results of lab  Orders: -     Testosterone; Future  Skin cancer screening -     Ambulatory referral to Dermatology  Return in about 11 months (around 04/27/2025) for CPE with fasting labs 1 week prior.   ___________________________________________ Lidia Clavijo de [place], MD, ABFM, CAQSM [facility]

## 2024-05-28 NOTE — Patient Instructions (Signed)
  Medication Instructions:  Your physician recommends that you continue on your current medications as directed. Please refer to the Current Medication list given to you today. --If you need a refill on any your medications before your next appointment, please call your pharmacy first. If no refills are authorized on file call the office.-- Lab Work: Your physician has recommended that you have lab work today: tomorrow - testosterone, other blood work 4-6 months If you have labs (blood work) drawn today and your tests are completely normal, you will receive your results via MyChart message OR a phone call from our staff.  Please ensure you check your voicemail in the event that you authorized detailed messages to be left on a delegated number. If you have any lab test that is abnormal or we need to change your treatment, we will call you to review the results.  Follow-Up: Your next appointment:   Your physician recommends that you schedule a follow-up appointment in: 11 months physical  with Dr. de [place]  You will receive a text message or e-mail with a link to a survey about your care and experience with us  today! We would greatly appreciate your feedback!   Thanks for letting us  be apart of your health journey!!  Primary Care and [facility] Medicine   Dr. Quintin sheerer [place]   We encourage you to activate your patient portal called MyChart.  Sign up information is provided on this After Visit Summary.  MyChart is used to connect with patients for Virtual Visits (Telemedicine).  Patients are able to view lab/test results, encounter notes, upcoming appointments, etc.  Non-urgent messages can be sent to your provider as well. To learn more about what you can do with MyChart, please visit --  [email or web address].

## 2024-05-28 NOTE — Assessment & Plan Note (Signed)
 Noted on prior labs with mild degree of thrombocytopenia.  He did have repeat labs recently and this shows continued thrombocytopenia, however improved compared to initial labs.  Most recent labs just below lower limit of normal.  No other lab abnormalities identified such as anemia or white blood cell issues. Given isolated thrombocytopenia which is fairly mild, would be reasonable to continue with monitoring.  Would plan to recheck CBC in about 4 to 6 months.  Will have patient return for lab appointment for this.  Further recommendations pending results of labs.  If notable progression of thrombocytopenia, consider referral to hematology for further evaluation.  If platelets remain stable or improved, then can continue with routine monitoring

## 2024-05-28 NOTE — Assessment & Plan Note (Signed)
 Patient notes that he has had small lesion over right forearm proximally.  This is something that he has noted in recent weeks/months.  He has not noticed any itching, pain, bleeding, discharge. On exam, he does have a very small lesion over the right forearm that is light brown in appearance, raised, slightly stuck on appearance.  No current drainage, bleeding.  No surrounding erythema. Area appears to be most consistent with very small seborrheic keratosis.  Discussed that based on appearance, area does appear to be related to benign skin lesion.  Would be reasonable to arrange for further evaluation with dermatology for this lesion as well as skin exam.  Referral has been placed today.

## 2024-05-28 NOTE — Assessment & Plan Note (Signed)
 Patient reports concerns related to decreased libido.  He wonders about having testosterone checked as a result of this.  Feels that he has also had some mood related symptoms. We discussed considerations, can proceed with testosterone evaluation at this time.  Discussed that this will typically be checked between 8 and 10 in the morning as this can fluctuate throughout the day and this provides most accurate assessment.  He will return to have this done in the near future, further recommendations pending results of lab

## 2024-06-01 ENCOUNTER — Encounter (HOSPITAL_BASED_OUTPATIENT_CLINIC_OR_DEPARTMENT_OTHER): Payer: Self-pay

## 2024-06-04 ENCOUNTER — Other Ambulatory Visit (HOSPITAL_BASED_OUTPATIENT_CLINIC_OR_DEPARTMENT_OTHER): Payer: Self-pay | Admitting: *Deleted

## 2024-06-04 DIAGNOSIS — R6882 Decreased libido: Secondary | ICD-10-CM

## 2024-06-05 LAB — TESTOSTERONE: Testosterone: 391 ng/dL (ref 264–916)

## 2024-06-08 ENCOUNTER — Ambulatory Visit (HOSPITAL_BASED_OUTPATIENT_CLINIC_OR_DEPARTMENT_OTHER): Payer: Self-pay | Admitting: Family Medicine

## 2024-06-08 DIAGNOSIS — D696 Thrombocytopenia, unspecified: Secondary | ICD-10-CM

## 2024-06-11 ENCOUNTER — Ambulatory Visit (HOSPITAL_BASED_OUTPATIENT_CLINIC_OR_DEPARTMENT_OTHER)

## 2024-10-28 ENCOUNTER — Ambulatory Visit (HOSPITAL_BASED_OUTPATIENT_CLINIC_OR_DEPARTMENT_OTHER)

## 2025-04-19 ENCOUNTER — Ambulatory Visit (HOSPITAL_BASED_OUTPATIENT_CLINIC_OR_DEPARTMENT_OTHER)

## 2025-04-27 ENCOUNTER — Encounter (HOSPITAL_BASED_OUTPATIENT_CLINIC_OR_DEPARTMENT_OTHER): Admitting: Family Medicine
